# Patient Record
Sex: Male | Born: 1965 | Hispanic: Yes | Marital: Married | Smoking: Current some day smoker
Health system: Southern US, Community
[De-identification: ages and names within clinical notes are randomized; demographics above are authoritative.]

## PROBLEM LIST (undated history)

## (undated) DIAGNOSIS — R519 Headache, unspecified: Secondary | ICD-10-CM

## (undated) DIAGNOSIS — F1090 Alcohol use, unspecified, uncomplicated: Secondary | ICD-10-CM

## (undated) DIAGNOSIS — Z789 Other specified health status: Secondary | ICD-10-CM

## (undated) DIAGNOSIS — Z87891 Personal history of nicotine dependence: Secondary | ICD-10-CM

## (undated) HISTORY — DX: Other specified health status: Z78.9

## (undated) HISTORY — DX: Personal history of nicotine dependence: Z87.891

## (undated) HISTORY — DX: Alcohol use, unspecified, uncomplicated: F10.90

## (undated) HISTORY — DX: Headache, unspecified: R51.9

## (undated) HISTORY — PX: HAND SURGERY: SHX662

---

## 2021-10-21 ENCOUNTER — Encounter: Payer: Self-pay | Admitting: *Deleted

## 2021-10-21 ENCOUNTER — Other Ambulatory Visit: Payer: Self-pay

## 2021-10-21 DIAGNOSIS — R519 Headache, unspecified: Secondary | ICD-10-CM | POA: Diagnosis not present

## 2021-10-21 DIAGNOSIS — R42 Dizziness and giddiness: Secondary | ICD-10-CM | POA: Diagnosis not present

## 2021-10-21 DIAGNOSIS — R112 Nausea with vomiting, unspecified: Secondary | ICD-10-CM | POA: Insufficient documentation

## 2021-10-21 LAB — BASIC METABOLIC PANEL
Anion gap: 8 (ref 5–15)
BUN: 16 mg/dL (ref 6–20)
CO2: 23 mmol/L (ref 22–32)
Calcium: 9.4 mg/dL (ref 8.9–10.3)
Chloride: 106 mmol/L (ref 98–111)
Creatinine, Ser: 0.95 mg/dL (ref 0.61–1.24)
GFR, Estimated: >60 mL/min (ref >60)
Glucose, Bld: 143 mg/dL — ABNORMAL HIGH (ref 70–99)
Potassium: 3.8 mmol/L (ref 3.5–5.1)
Sodium: 137 mmol/L (ref 135–145)

## 2021-10-21 LAB — CBC
HCT: 45.1 % (ref 39.0–52.0)
Hemoglobin: 15.8 g/dL (ref 13.0–17.0)
MCH: 33.1 pg (ref 26.0–34.0)
MCHC: 35 g/dL (ref 30.0–36.0)
MCV: 94.5 fL (ref 80.0–100.0)
Platelets: 178 10*3/uL (ref 150–400)
RBC: 4.77 MIL/uL (ref 4.22–5.81)
RDW: 11.4 % — ABNORMAL LOW (ref 11.5–15.5)
WBC: 8.3 10*3/uL (ref 4.0–10.5)
nRBC: 0 % (ref 0.0–0.2)

## 2021-10-21 LAB — TROPONIN I (HIGH SENSITIVITY): Troponin I (High Sensitivity): <2 ng/L (ref <18)

## 2021-10-21 NOTE — ED Triage Notes (Signed)
Pt to ED reporting dizziness and vomiting since yesterday. Pt has hx of vertigo years ago and feels it is the same again. Pt vomiting whenever he moves his head too quickly.

## 2021-10-21 NOTE — ED Triage Notes (Signed)
First Nurse Note:  C/O vertigo x 1 day.

## 2021-10-22 ENCOUNTER — Other Ambulatory Visit: Payer: Self-pay

## 2021-10-22 ENCOUNTER — Emergency Department
Admission: EM | Admit: 2021-10-22 | Discharge: 2021-10-22 | Disposition: A | Discharge status: Home / Self Care | Payer: BC Managed Care – PPO | Attending: Emergency Medicine | Admitting: Emergency Medicine

## 2021-10-22 ENCOUNTER — Emergency Department: Payer: BC Managed Care – PPO

## 2021-10-22 DIAGNOSIS — R42 Dizziness and giddiness: Secondary | ICD-10-CM

## 2021-10-22 LAB — TROPONIN I (HIGH SENSITIVITY): Troponin I (High Sensitivity): <2 ng/L (ref <18)

## 2021-10-22 MED ORDER — LACTATED RINGERS IV BOLUS
1000.0000 mL | Freq: Once | INTRAVENOUS | Status: AC
  Administered 2021-10-22: 1000 mL via INTRAVENOUS

## 2021-10-22 MED ORDER — MECLIZINE HCL 25 MG PO TABS
25.0000 mg | ORAL_TABLET | Freq: Once | ORAL | Status: AC
  Administered 2021-10-22: 25 mg via ORAL
  Filled 2021-10-22: qty 1

## 2021-10-22 MED ORDER — MECLIZINE HCL 25 MG PO TABS: 25.0000 mg | ORAL_TABLET | Freq: Three times a day (TID) | ORAL | 0 refills | Status: DC | PRN

## 2021-10-22 MED ORDER — ONDANSETRON HCL 4 MG/2ML IJ SOLN
4.0000 mg | Freq: Once | INTRAMUSCULAR | Status: AC
  Administered 2021-10-22: 4 mg via INTRAVENOUS
  Filled 2021-10-22: qty 2

## 2021-10-22 NOTE — ED Notes (Signed)
Pt to MRI

## 2021-10-22 NOTE — ED Provider Notes (Signed)
Patient was signed out to me at 7 AM pending an MRI of his brain.  He has history of peripheral vertigo presents today with symptoms consistent with peripheral vertigo but persistent difficulty ambulating and symptoms after meclizine for MRI brain ordered to rule out central etiology.  Since MRI is negative.  We will provide a prescription for meclizine and ENT follow-up.   Georga Hacking, MD 10/22/21 [id number]

## 2021-10-22 NOTE — ED Notes (Signed)
Pt to CT

## 2021-10-22 NOTE — ED Notes (Signed)
Pt back from CT

## 2021-10-22 NOTE — ED Notes (Signed)
Pt a/o, states that he is still dizzy with ambulation. Edp at bedside.

## 2021-10-22 NOTE — ED Provider Notes (Signed)
[facility] Emergency Department Provider Note  ____________________________________________  Time seen: Approximately 4:29 AM  I have reviewed the triage vital signs and the nursing notes.   HISTORY  Chief Complaint Dizziness   HPI Ricardo Robertson is a 55 y.o. male with no significant past medical history who presents for evaluation of dizziness.  Patient reports 1 similar episode 14 years ago when he was diagnosed with vertigo and seen by ENT.  Patient reports that he had to do several months of inner ear exercise for his symptoms to resolve.  Patient reports that this episode started yesterday.  Sudden onset of severe room spinning sensation with, worse with head movement and associated with nausea and vomiting.  Today he feels slightly better but still present if he does brisk movements with his had.  He denies feeling lightheaded.  Reports that he had some mild soreness in the left side of his head but denies headache, chest pain, tinnitus, hearing loss, diplopia, dysarthria, dysphagia, slurred speech, facial droop, or unilateral weakness or numbness.  Patient reports that he smokes cigars.  Denies family history of stroke, aneurysm or SAH  PMH Vertigo   Allergies Patient has no known allergies.  History reviewed. No pertinent family history.  Social History Social History   Tobacco Use   Smoking status: Never   Smokeless tobacco: Never  Substance Use Topics   Alcohol use: Yes    Comment: socially    Review of Systems  Constitutional: Negative for fever. Eyes: Negative for visual changes. ENT: Negative for sore throat. Neck: No neck pain  Cardiovascular: Negative for chest pain. Respiratory: Negative for shortness of breath. Gastrointestinal: Negative for abdominal pain or diarrhea. + N/V Genitourinary: Negative for dysuria. Musculoskeletal: Negative for back pain. Skin: Negative for rash. Neurological: Negative for headaches,  weakness or numbness. + vertigo Psych: No SI or HI  ____________________________________________   PHYSICAL EXAM:  VITAL SIGNS: ED Triage Vitals [10/21/21 2003]  Enc Vitals Group     BP 124/84     Pulse Rate 76     Resp 16     Temp 97.6 F (36.4 C)     Temp Source Oral     SpO2 98 %     Weight      Height      Head Circumference      Peak Flow      Pain Score      Pain Loc      Pain Edu?      Excl. in GC?     Constitutional: Alert and oriented. Well appearing and in no apparent distress. HEENT:      Head: Normocephalic and atraumatic.         Eyes: Conjunctivae are normal. Sclera is non-icteric.       Mouth/Throat: Mucous membranes are moist.       Neck: Supple with no signs of meningismus. Cardiovascular: Regular rate and rhythm. No murmurs, gallops, or rubs. 2+ symmetrical distal pulses are present in all extremities. No JVD. Respiratory: Normal respiratory effort. Lungs are clear to auscultation bilaterally.  Gastrointestinal: Soft, non tender. Musculoskeletal:  No edema, cyanosis, or erythema of extremities. Neuro: A & O x3, PERRL, EOMI, no nystagmus, CN II-XII intact, motor testing reveals good tone and bulk throughout. There is no evidence of pronator drift or dysmetria. Muscle strength is 5/5 throughout. Deep tendon reflexes are 2+ throughout with downgoing toes. Sensory examination is intact. Gait is normal. Skin: Skin is warm, dry  and intact. No rash noted. Psychiatric: Mood and affect are normal. Speech and behavior are normal.  ____________________________________________   LABS (all labs ordered are listed, but only abnormal results are displayed)  Labs Reviewed  BASIC METABOLIC PANEL - Abnormal; Notable for the following components:      Result Value   Glucose, Bld 143 (*)    All other components within normal limits  CBC - Abnormal; Notable for the following components:   RDW 11.4 (*)    All other components within normal limits  TROPONIN I (HIGH  SENSITIVITY)  TROPONIN I (HIGH SENSITIVITY)   ____________________________________________  EKG  ED ECG REPORT I, Nita Sickle, the attending physician, personally viewed and interpreted this ECG.  Normal sinus rhythm with a rate of 75, normal intervals, normal axis, no ST elevations or depressions. ____________________________________________  RADIOLOGY  I have personally reviewed the images performed during this visit and I agree with the Radiologist's read.   Interpretation by Radiologist:  CT HEAD WO CONTRAST ( )  Result Date: 10/22/2021 CLINICAL DATA:  Dizziness.  Vomiting. EXAM: CT HEAD WITHOUT CONTRAST TECHNIQUE: Contiguous axial images were obtained from the base of the skull through the vertex without intravenous contrast. COMPARISON:  None. FINDINGS: Brain: There is no evidence for acute hemorrhage, hydrocephalus, mass lesion, or abnormal extra-axial fluid collection. No definite CT evidence for acute infarction. Vascular: No hyperdense vessel or unexpected calcification. Skull: No evidence for fracture. No worrisome lytic or sclerotic lesion. Sinuses/Orbits: The visualized paranasal sinuses and mastoid air cells are clear. Visualized portions of the globes and intraorbital fat are unremarkable. Other: None. IMPRESSION: No acute intracranial abnormality. Electronically Signed   By: Kennith Center M.D.   On: 10/22/2021 05:48     ____________________________________________   PROCEDURES  Procedure(s) performed:yes .1-3 Lead EKG Interpretation Performed by: Nita Sickle, MD Authorized by: Nita Sickle, MD     Interpretation: normal     ECG rate assessment: normal     Rhythm: sinus rhythm     Ectopy: none     Conduction: normal     Critical Care performed:  None ____________________________________________   INITIAL IMPRESSION / ASSESSMENT AND PLAN / ED COURSE  55 y.o. male with no significant past medical history who presents for evaluation of  dizziness worse with head movement described as room spinning sensation associated with nausea and vomiting x 2 days. Similar symptoms 14 years ago and diagnosed with peripheral vertigo.  Patient is extremely well-appearing in no distress, normal vital signs, completely normal neurological exam.  Differential diagnosis including peripheral vertigo versus central vertigo versus subarachnoid hemorrhage versus cerebellar stroke versus dehydration versus dysrhythmias.  EKG with no signs of dysrhythmias.  Patient placed on telemetry for monitoring.  Labs showing no signs of significant dehydration, AKI, electrolyte derangements, anemia.  2 high-sensitivity troponins negative with no signs of ischemia.  Head CT is pending.  Will treat with meclizine, IV fluids and Zofran.  History gathered from patient and his wife's at bedside.  Plan discussed with both of them.  _________________________ 6:00 AM on 10/22/2021 ----------------------------------------- CT unremarkable.  After meclizine, fluids and Zofran I attempted to ambulate patient.  He still feels extremely off balance with a wide unsteady gait.  We will pursue an MRI.  _________________________ 7:01 AM on 10/22/2021 ----------------------------------------- MRI pending. Care transferred to incoming MD.     _____________________________________________ Please note:  Patient was evaluated in Emergency Department today for the symptoms described in the history of present illness. Patient was evaluated in the context  of the global COVID-19 pandemic, which necessitated consideration that the patient might be at risk for infection with the SARS-CoV-2 virus that causes COVID-19. Institutional protocols and algorithms that pertain to the evaluation of patients at risk for COVID-19 are in a state of rapid change based on information released by regulatory bodies including the CDC and federal and state organizations. These policies and algorithms were followed  during the patient's care in the ED.  Some ED evaluations and interventions may be delayed as a result of limited staffing during the pandemic.   [place] Controlled Substance Database was reviewed by me. ____________________________________________   FINAL CLINICAL IMPRESSION(S) / ED DIAGNOSES   Final diagnoses:  Vertigo      NEW MEDICATIONS STARTED DURING THIS VISIT:  ED Discharge Orders     None        Note:  This document was prepared using Dragon voice recognition software and may include unintentional dictation errors.    Nita Sickle, MD 10/22/21 [phone]

## 2022-10-24 IMAGING — CT CT HEAD W/O CM
3 series · 16 of 47 positions shown, 19 images · non-contrast
Comparison: None.

CLINICAL DATA: Dizziness.  Vomiting.

EXAM:
CT HEAD WITHOUT CONTRAST
TECHNIQUE: Contiguous axial images were obtained from the base of the skull
through the vertex without intravenous contrast.

[Series 3: head wo · axial · 0.42mm/px · z∈[-131,-6]mm · 10 of 30 slices shown, 13 images]
[im 3/30  brain]
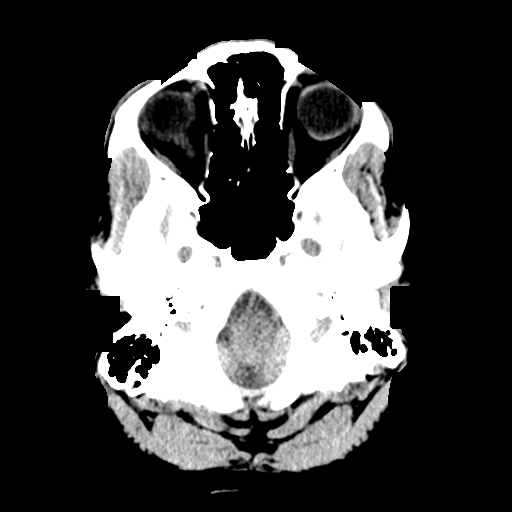
[im 3/30  bone]
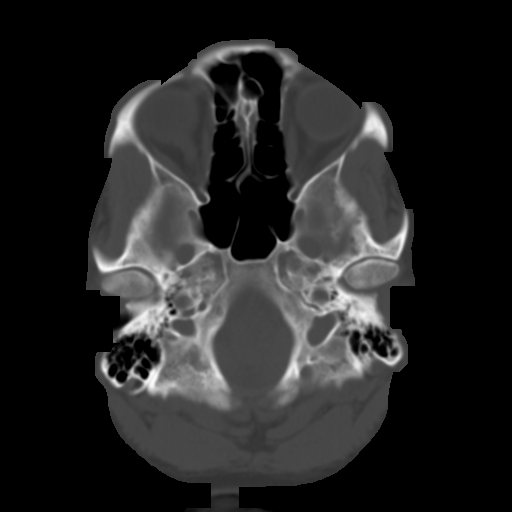
[im 6/30  brain]
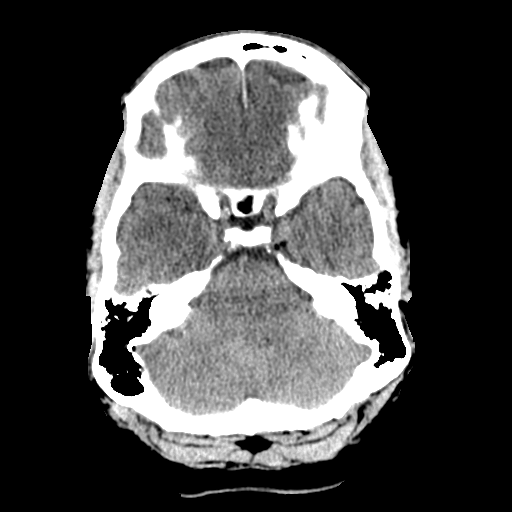
[im 9/30  brain]
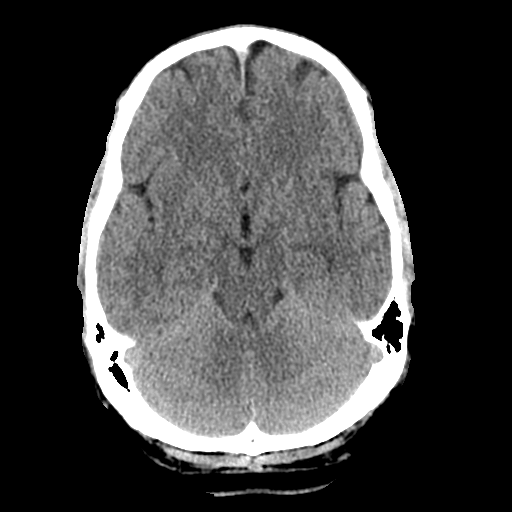
[im 11/30  brain]
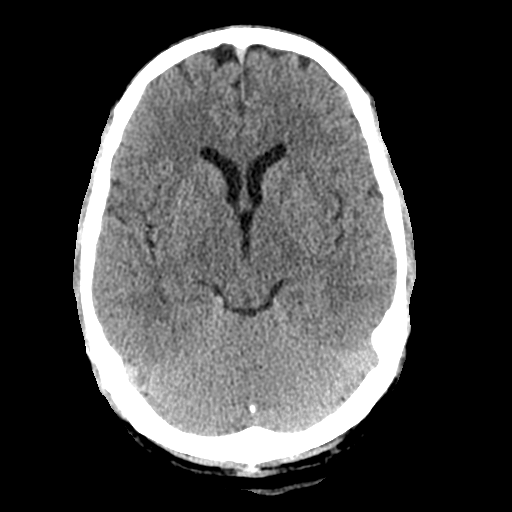
[im 14/30  brain]
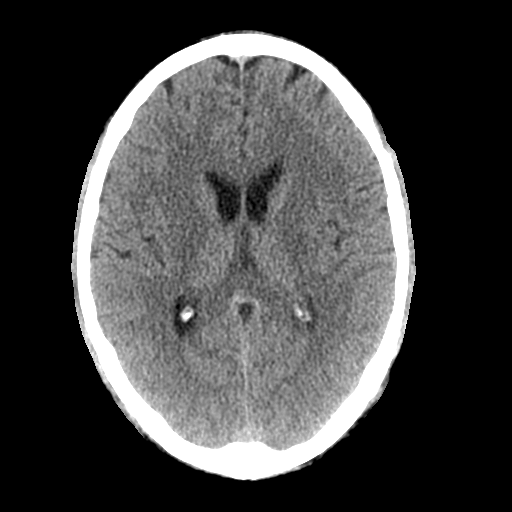
[im 14/30  bone]
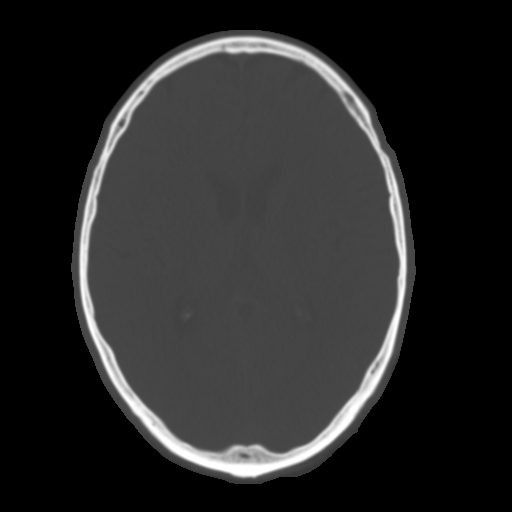
[im 17/30  brain]
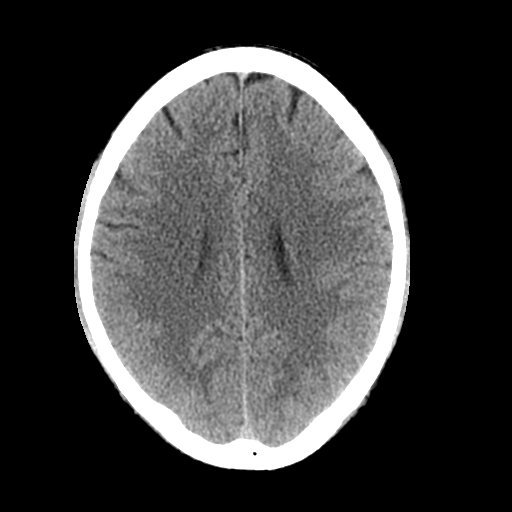
[im 20/30  brain]
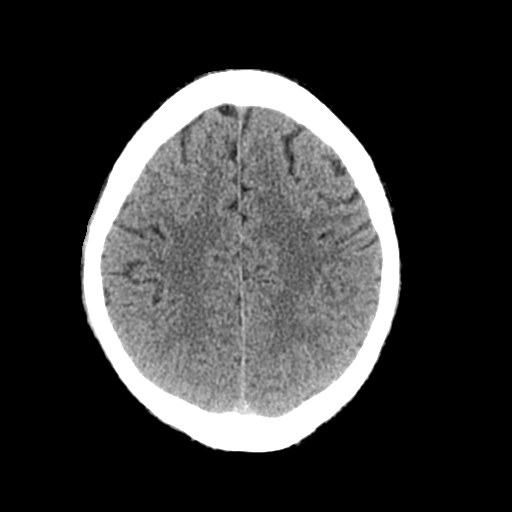
[im 23/30  brain]
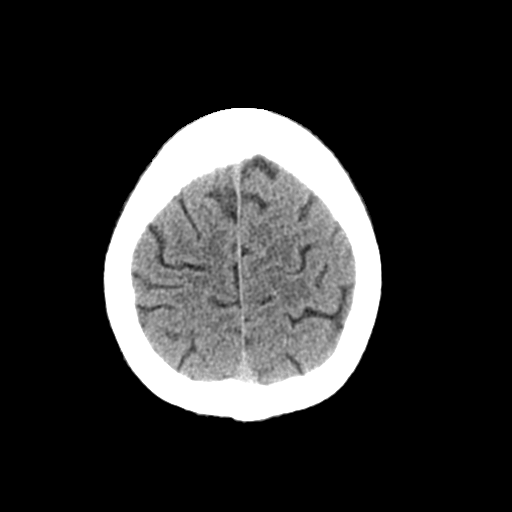
[im 25/30  brain]
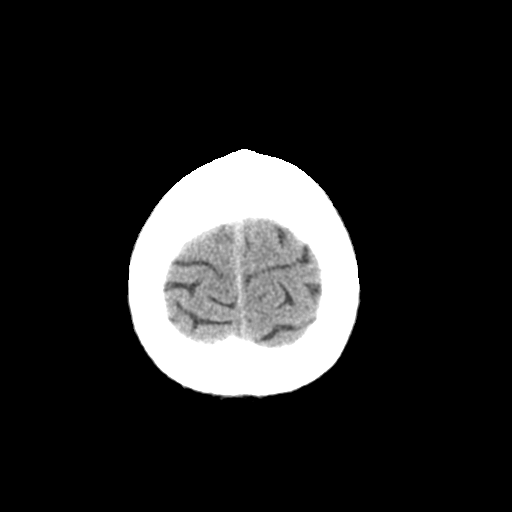
[im 25/30  bone]
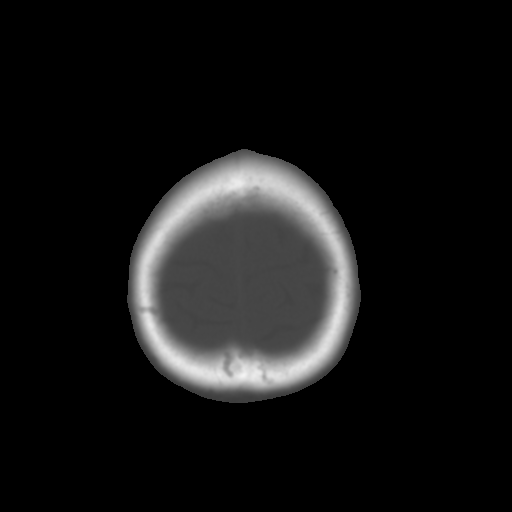
[im 28/30  brain]
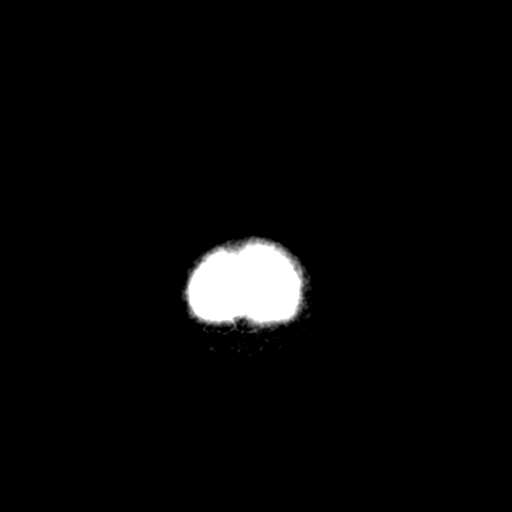

[Series 4: coronal soft tissue · coronal · 0.31mm/px · 3 of 68 slices shown]
[im 23/68  brain]
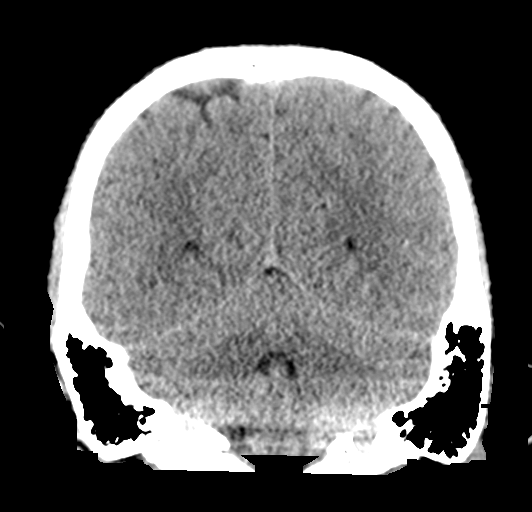
[im 30/68  brain]
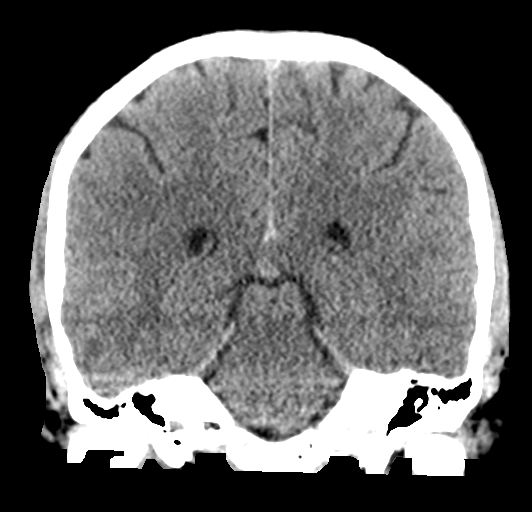
[im 38/68  brain]
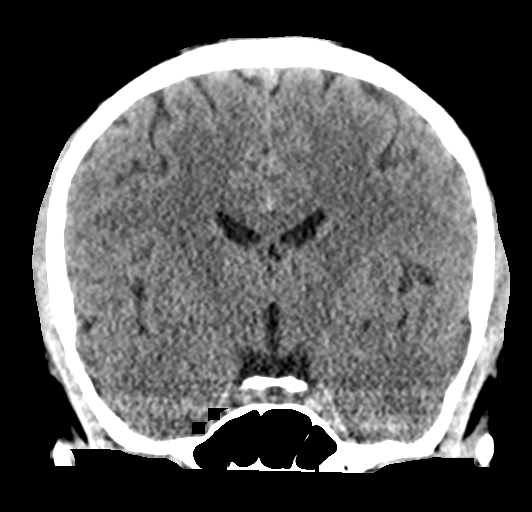

[Series 5: sagittal soft tissue · sagittal · 0.31mm/px · 3 of 55 slices shown]
[im 19/55  brain]
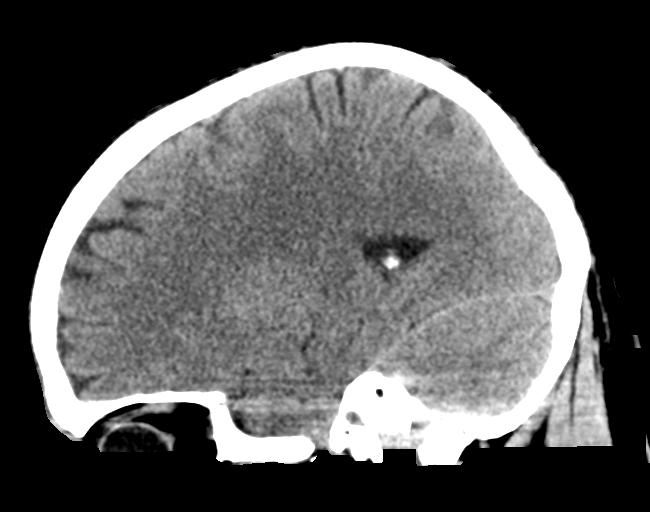
[im 28/55  brain]
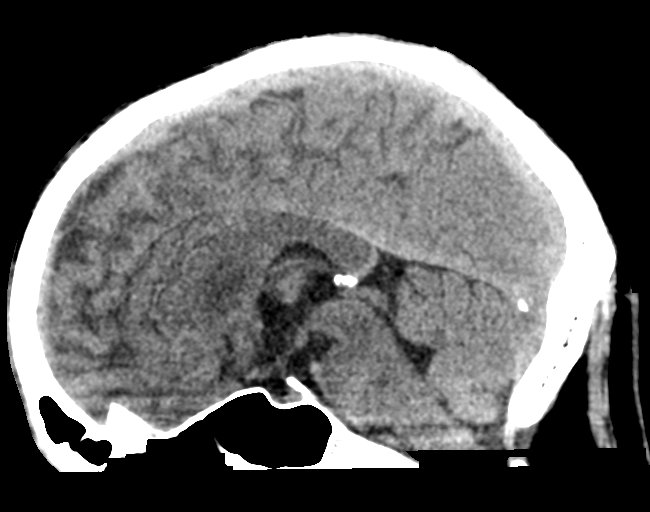
[im 37/55  brain]
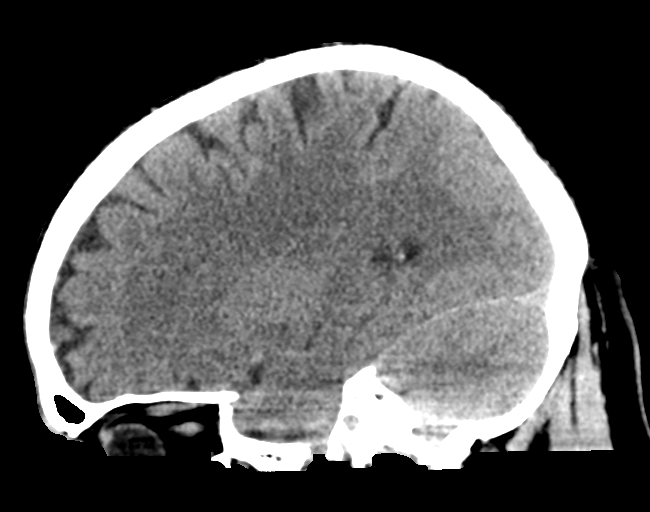

[16 of 47 positions shown; findings below may reference images not displayed]

FINDINGS: Brain: There is no evidence for acute hemorrhage, hydrocephalus,
mass lesion, or abnormal extra-axial fluid collection. No definite
CT evidence for acute infarction.

Vascular: No hyperdense vessel or unexpected calcification.

Skull: No evidence for fracture. No worrisome lytic or sclerotic
lesion.

Sinuses/Orbits: The visualized paranasal sinuses and mastoid air
cells are clear. Visualized portions of the globes and intraorbital
fat are unremarkable.

Other: None.
IMPRESSION: No acute intracranial abnormality.

## 2023-09-28 ENCOUNTER — Encounter: Payer: Self-pay | Admitting: Emergency Medicine

## 2023-09-28 ENCOUNTER — Ambulatory Visit
Admission: EM | Admit: 2023-09-28 | Discharge: 2023-09-28 | Disposition: A | Discharge status: Home / Self Care | Payer: BC Managed Care – PPO | Attending: Physician Assistant | Admitting: Physician Assistant

## 2023-09-28 DIAGNOSIS — M5441 Lumbago with sciatica, right side: Secondary | ICD-10-CM | POA: Diagnosis not present

## 2023-09-28 MED ORDER — CYCLOBENZAPRINE HCL 10 MG PO TABS: 10.0000 mg | ORAL_TABLET | Freq: Two times a day (BID) | ORAL | 0 refills | Status: DC | PRN

## 2023-09-28 MED ORDER — PREDNISONE 20 MG PO TABS: 40.0000 mg | ORAL_TABLET | Freq: Every day | ORAL | 0 refills | Status: AC

## 2023-09-28 NOTE — ED Triage Notes (Signed)
Patient states that x 2 days ago he was lifting a bag of sand and turned the wrong way.  Now he is having right sided lower back pain that radiates down right leg.  Patient has been taken Motrin for pain.

## 2023-09-28 NOTE — ED Provider Notes (Signed)
[facility]    CSN: [id number] Arrival date & time: 09/28/23  1426      History   Chief Complaint Chief Complaint  Patient presents with   Back Pain    HPI Ricardo Robertson is a 57 y.o. male.   Patient here today for evaluation of bilateral low back pain that started 2 days ago after he was lifting a bag of sand and turned the wrong way.  He states that most of his pain is present to his right lower back and does radiate down his right leg at times.  He denies any numbness or weakness.  He has tried taking Motrin without resolution.  The history is provided by the patient.  Back Pain Associated symptoms: no fever, no numbness and no weakness     History reviewed. No pertinent past medical history.  There are no problems to display for this patient.   History reviewed. No pertinent surgical history.     Home Medications    Prior to Admission medications   Medication Sig Start Date End Date Taking? Authorizing Provider  cyclobenzaprine (FLEXERIL) 10 MG tablet Take 1 tablet (10 mg total) by mouth 2 (two) times daily as needed for muscle spasms. 09/28/23  Yes Tomi Bamberger, PA-C  predniSONE (DELTASONE) 20 MG tablet Take 2 tablets (40 mg total) by mouth daily with breakfast for 5 days. 09/28/23 10/03/23 Yes Tomi Bamberger, PA-C  meclizine (ANTIVERT) 25 MG tablet Take 1 tablet (25 mg total) by mouth 3 (three) times daily as needed for dizziness. 10/22/21   Nita Sickle, MD    Family History History reviewed. No pertinent family history.  Social History Social History   Tobacco Use   Smoking status: Some Days    Types: Cigars   Smokeless tobacco: Never  Vaping Use   Vaping status: Never Used  Substance Use Topics   Alcohol use: Yes    Comment: socially   Drug use: Never     Allergies   Patient has no known allergies.   Review of Systems Review of Systems  Constitutional:  Negative for chills and fever.  Eyes:  Negative for  discharge and redness.  Respiratory:  Negative for shortness of breath.   Musculoskeletal:  Positive for back pain.  Neurological:  Negative for weakness and numbness.     Physical Exam Triage Vital Signs ED Triage Vitals  Encounter Vitals Group     BP 09/28/23 1514 113/76     Systolic BP Percentile --      Diastolic BP Percentile --      Pulse Rate 09/28/23 1514 72     Resp 09/28/23 1514 18     Temp 09/28/23 1514 98.6 F (37 C)     Temp Source 09/28/23 1514 Oral     SpO2 09/28/23 1514 94 %     Weight 09/28/23 1516 163 lb (73.9 kg)     Height 09/28/23 1516 5\' 7"  (1.702 m)     Head Circumference --      Peak Flow --      Pain Score 09/28/23 1515 3     Pain Loc --      Pain Education --      Exclude from Growth Chart --    No data found.  Updated Vital Signs BP 113/76 (BP Location: Left Arm)   Pulse 72   Temp 98.6 F (37 C) (Oral)   Resp 18   Ht 5\' 7"  (1.702 m)  Wt 163 lb (73.9 kg)   SpO2 94%   BMI 25.53 kg/m      Physical Exam Vitals and nursing note reviewed.  Constitutional:      General: He is not in acute distress.    Appearance: Normal appearance. He is not ill-appearing.  HENT:     Head: Normocephalic and atraumatic.  Eyes:     Conjunctiva/sclera: Conjunctivae normal.  Cardiovascular:     Rate and Rhythm: Normal rate.  Pulmonary:     Effort: Pulmonary effort is normal. No respiratory distress.  Musculoskeletal:     Comments: No tenderness to palpation to midline thoracic or lumbar spine.  Mild tenderness to palpation to right lower back  Neurological:     Mental Status: He is alert.  Psychiatric:        Mood and Affect: Mood normal.        Behavior: Behavior normal.        Thought Content: Thought content normal.      UC Treatments / Results  Labs (all labs ordered are listed, but only abnormal results are displayed) Labs Reviewed - No data to display  EKG   Radiology No results found.  Procedures Procedures (including critical  care time)  Medications Ordered in UC Medications - No data to display  Initial Impression / Assessment and Plan / UC Course  I have reviewed the triage vital signs and the nursing notes.  Pertinent labs & imaging results that were available during my care of the patient were reviewed by me and considered in my medical decision making (see chart for details).   Suspect likely muscular strain with associated sciatica.  Will treat with steroid burst muscle relaxer.  Advised that muscle relaxer may cause drowsiness and use with caution.  Encouraged follow-up with any further concerns or persistent or worsening symptoms.  Final Clinical Impressions(s) / UC Diagnoses   Final diagnoses:  Acute bilateral low back pain with right-sided sciatica   Discharge Instructions   None    ED Prescriptions     Medication Sig Dispense Auth. Provider   predniSONE (DELTASONE) 20 MG tablet Take 2 tablets (40 mg total) by mouth daily with breakfast for 5 days. 10 tablet Erma Pinto F, PA-C   cyclobenzaprine (FLEXERIL) 10 MG tablet Take 1 tablet (10 mg total) by mouth 2 (two) times daily as needed for muscle spasms. 20 tablet Tomi Bamberger, PA-C      PDMP not reviewed this encounter.   Tomi Bamberger, PA-C 09/28/23 1554

## 2023-09-29 ENCOUNTER — Encounter: Payer: Self-pay | Admitting: Family

## 2023-09-29 ENCOUNTER — Ambulatory Visit: Payer: BC Managed Care – PPO | Admitting: Family

## 2023-09-29 VITALS — BP 128/76 | HR 80 | Temp 98.1°F | Resp 20 | Ht 67.0 in | Wt 167.6 lb

## 2023-09-29 DIAGNOSIS — Z789 Other specified health status: Secondary | ICD-10-CM | POA: Insufficient documentation

## 2023-09-29 DIAGNOSIS — R519 Headache, unspecified: Secondary | ICD-10-CM | POA: Diagnosis not present

## 2023-09-29 DIAGNOSIS — Z2821 Immunization not carried out because of patient refusal: Secondary | ICD-10-CM | POA: Insufficient documentation

## 2023-09-29 DIAGNOSIS — Z13220 Encounter for screening for lipoid disorders: Secondary | ICD-10-CM

## 2023-09-29 DIAGNOSIS — Z1211 Encounter for screening for malignant neoplasm of colon: Secondary | ICD-10-CM

## 2023-09-29 DIAGNOSIS — Z113 Encounter for screening for infections with a predominantly sexual mode of transmission: Secondary | ICD-10-CM

## 2023-09-29 DIAGNOSIS — Z87891 Personal history of nicotine dependence: Secondary | ICD-10-CM | POA: Insufficient documentation

## 2023-09-29 DIAGNOSIS — M545 Low back pain, unspecified: Secondary | ICD-10-CM | POA: Diagnosis not present

## 2023-09-29 DIAGNOSIS — Z125 Encounter for screening for malignant neoplasm of prostate: Secondary | ICD-10-CM

## 2023-09-29 DIAGNOSIS — Z1159 Encounter for screening for other viral diseases: Secondary | ICD-10-CM

## 2023-09-29 NOTE — Progress Notes (Signed)
Provider: Richarda Blade FNP-C   Rayonna Heldman, Donalee Citrin, NP  Patient Care Team: Sheyann Sulton, Donalee Citrin, NP as PCP - General (Family Medicine)  Extended Emergency Contact Information Primary Emergency Contact: ramirez,mildred Mobile Phone: [phone] Relation: Spouse  Code Status:  Full Code  Goals of care: Advanced Directive information    10/21/2021    8:05 PM  Advanced Directives  Does Patient Have a Medical Advance Directive? No  Would patient like information on creating a medical advance directive? No - Patient declined     Chief Complaint  Patient presents with   New Patient (Initial Visit)    Patient presents today for new patient appointment.    HPI:  Pt is a 57 y.o. male seen today establish care here at [facility]. States has no chronic medical conditions.  He pulled a muscle on Sunday while trying to lift sand bag.He went to urgent care yesterday 09/27/2022 was prescribed prednisone and Flexeril.states started on prednisone this morning.still has pain on lower back worst on the right side.He denies any radiation to lower extremities, numbness,tingling or loss of bladder or bowel.   Former cigarette smoker - smoked from 18 yrs to 30 yrs 6-8 cigarettes per day.   Drinks 6 beers per week  No use of illicit drugs.   Headaches - Has occasional headache.described as behind both eyes.tends to occur with drinking of beer but sometimes occurs without any use of beer.usually drinks coffee and it goes away.No nausea or vomiting.Motrin effective. Tylenol does not help.   States used to exercise but currently just does a lot of walking up and down and stays busy at work.   Request Testosterone level to be checked but declines any sexual /erectile dysfunction.wonders whether need to be checked since he is in his 14's.  Would also like prostate screening.   Immunization -due for COVID 19, DTaP, influenza and zoster vaccine.  But declines states does not need  vaccines.  History reviewed. No pertinent past medical history. Past Surgical History:  Procedure Laterality Date   HAND SURGERY Right     No Known Allergies  Allergies as of 09/29/2023   No Known Allergies      Medication List        Accurate as of September 29, 2023 10:38 AM. If you have any questions, ask your nurse or doctor.          cyclobenzaprine 10 MG tablet Commonly known as: FLEXERIL Take 1 tablet (10 mg total) by mouth 2 (two) times daily as needed for muscle spasms.   meclizine 25 MG tablet Commonly known as: ANTIVERT Take 1 tablet (25 mg total) by mouth 3 (three) times daily as needed for dizziness.   predniSONE 20 MG tablet Commonly known as: DELTASONE Take 2 tablets (40 mg total) by mouth daily with breakfast for 5 days.        Review of Systems  Constitutional:  Negative for appetite change, chills, fatigue, fever and unexpected weight change.  HENT:  Negative for congestion, dental problem, ear discharge, ear pain, facial swelling, hearing loss, nosebleeds, postnasal drip, rhinorrhea, sinus pressure, sinus pain, sneezing, sore throat, tinnitus and trouble swallowing.   Eyes:  Positive for visual disturbance. Negative for pain, discharge, redness and itching.       Wears eye glasses has not seen ophthalmologist in one year  Respiratory:  Negative for cough, chest tightness, shortness of breath and wheezing.   Cardiovascular:  Negative for chest pain, palpitations and  leg swelling.  Gastrointestinal:  Negative for abdominal distention, abdominal pain, blood in stool, constipation, diarrhea, nausea and vomiting.  Endocrine: Negative for cold intolerance, heat intolerance, polydipsia, polyphagia and polyuria.  Genitourinary:  Negative for difficulty urinating, dysuria, flank pain, frequency and urgency.  Musculoskeletal:  Negative for arthralgias, back pain, gait problem, joint swelling, myalgias, neck pain and neck stiffness.  Skin:  Negative for color  change, pallor, rash and wound.  Neurological:  Negative for dizziness, syncope, speech difficulty, weakness, light-headedness, numbness and headaches.  Hematological:  Does not bruise/bleed easily.  Psychiatric/Behavioral:  Negative for agitation, behavioral problems, confusion, hallucinations, self-injury, sleep disturbance and suicidal ideas. The patient is not nervous/anxious.      There is no immunization history on file for this patient. Pertinent  Health Maintenance Due  Topic Date Due   Colonoscopy  Never done   INFLUENZA VACCINE  Never done      10/21/2021    8:06 PM  Fall Risk  (RETIRED) Patient Fall Risk Level Low fall risk   Functional Status Survey:    Vitals:   09/29/23 1004  BP: 128/76  Pulse: 80  Resp: 20  Temp: 98.1 F (36.7 C)  SpO2: 98%  Weight: 167 lb 9.6 oz (76 kg)  Height: 5\' 7"  (1.702 m)   Body mass index is 26.25 kg/m. Physical Exam Vitals reviewed.  Constitutional:      General: He is not in acute distress.    Appearance: Normal appearance. He is overweight. He is not ill-appearing or diaphoretic.  HENT:     Head: Normocephalic.     Right Ear: Tympanic membrane, ear canal and external ear normal. There is no impacted cerumen.     Left Ear: Tympanic membrane, ear canal and external ear normal. There is no impacted cerumen.     Nose: Nose normal. No congestion or rhinorrhea.     Mouth/Throat:     Mouth: Mucous membranes are moist.     Pharynx: Oropharynx is clear. No oropharyngeal exudate or posterior oropharyngeal erythema.  Eyes:     General: No scleral icterus.       Right eye: No discharge.        Left eye: No discharge.     Extraocular Movements: Extraocular movements intact.     Conjunctiva/sclera: Conjunctivae normal.     Pupils: Pupils are equal, round, and reactive to light.  Neck:     Vascular: No carotid bruit.  Cardiovascular:     Rate and Rhythm: Normal rate and regular rhythm.     Pulses: Normal pulses.     Heart sounds:  Normal heart sounds. No murmur heard.    No friction rub. No gallop.  Pulmonary:     Effort: Pulmonary effort is normal. No respiratory distress.     Breath sounds: Normal breath sounds. No wheezing, rhonchi or rales.  Chest:     Chest wall: No tenderness.  Abdominal:     General: Bowel sounds are normal. There is no distension.     Palpations: Abdomen is soft. There is no mass.     Tenderness: There is no abdominal tenderness. There is no right CVA tenderness, left CVA tenderness, guarding or rebound.  Musculoskeletal:        General: No swelling or tenderness. Normal range of motion.     Cervical back: Normal range of motion. No rigidity or tenderness.     Right lower leg: No edema.     Left lower leg: No edema.  Lymphadenopathy:  Cervical: No cervical adenopathy.  Skin:    General: Skin is warm and dry.     Coloration: Skin is not pale.     Findings: No bruising, erythema, lesion or rash.  Neurological:     Mental Status: He is alert and oriented to person, place, and time.     Cranial Nerves: No cranial nerve deficit.     Sensory: No sensory deficit.     Motor: No weakness.     Coordination: Coordination normal.     Gait: Gait normal.  Psychiatric:        Mood and Affect: Mood normal.        Speech: Speech normal.        Behavior: Behavior normal.        Thought Content: Thought content normal.        Judgment: Judgment normal.     Labs reviewed: No results for input(s): "NA", "K", "CL", "CO2", "GLUCOSE", "BUN", "CREATININE", "CALCIUM", "MG", "PHOS" in the last 8760 hours. No results for input(s): "AST", "ALT", "ALKPHOS", "BILITOT", "PROT", "ALBUMIN" in the last 8760 hours. No results for input(s): "WBC", "NEUTROABS", "HGB", "HCT", "MCV", "PLT" in the last 8760 hours. No results found for: "TSH" No results found for: "HGBA1C" No results found for: "CHOL", "HDL", "LDLCALC", "LDLDIRECT", "TRIG", "CHOLHDL"  Significant Diagnostic Results in last 30 days:  No results  found.  Assessment/Plan 1.. Headache behind the eyes Chronic seems to be related to [facility] intake.  Motrin effective -Beer use cessation advised. - COMPLETE METABOLIC PANEL WITH GFR; Future - CBC with Differential/Platelet; Future  2. Acute left-sided low back pain without sciatica Status post urgent care visit 09/28/2023 after he pulled a muscle while lifting up sand bag -Continue on prednisone and Flexeril as prescribed in the urgent care -Notify provider if symptoms worsen or not resolved   3. Screening for hyperlipidemia no recent LDL for review -Dietary modification and exercise at least 30 minutes 3 times per week - Lipid panel; Future  4. Screen for STD (sexually transmitted disease) Married.  No high risk behaviors reported - HIV Antibody (routine testing w rflx); Future  5. Encounter for hepatitis C screening test for low risk patient Low risk - Hepatitis C antibody; Future  6. Prostate cancer screening Request screening.  Asymptomatic - PSA, Total and Free; Future  7. Colon cancer screening Cologuard versus colonoscopy discussed patient prefers colonoscopy.  Will refer to gastroenterology.Made aware specialist office will call him to schedule appointment. - Ambulatory referral to Gastroenterology  8. Alcohol use Drinks 6 beers per week -Alcohol use cessation advised  10. Former light cigarette smoker (1-9 per day) Former cigarette smoker from age 57 to 36 years.Smoked 6-8 cigarettes per day.  Asymptomatic  11. Influenza vaccination declined Influenza vaccine offered but declines  12. Tetanus, diphtheria, and acellular pertussis (Tdap) vaccination declined Tdap due but patient declined  13. Herpes zoster vaccination declined Advised to get Shingrix vaccine at the pharmacy but declined.  Family/ staff Communication: Reviewed plan of care with patient verbalized understanding  Labs/tests ordered:  - COMPLETE METABOLIC PANEL WITH GFR; Future - CBC with  Differential/Platelet; Future - Hepatitis C antibody; Future - PSA, Total and Free; Future - HIV Antibody (routine testing w rflx); Future - Lipid panel; Future  Next Appointment : Return in about 1 year (around 09/28/2024) for annual Physical examination, Fasting labs in the morning.   Caesar Bookman, NP

## 2023-09-30 ENCOUNTER — Other Ambulatory Visit: Payer: BC Managed Care – PPO

## 2023-09-30 DIAGNOSIS — R519 Headache, unspecified: Secondary | ICD-10-CM

## 2023-09-30 DIAGNOSIS — Z113 Encounter for screening for infections with a predominantly sexual mode of transmission: Secondary | ICD-10-CM

## 2023-09-30 DIAGNOSIS — Z125 Encounter for screening for malignant neoplasm of prostate: Secondary | ICD-10-CM

## 2023-09-30 DIAGNOSIS — Z13220 Encounter for screening for lipoid disorders: Secondary | ICD-10-CM

## 2023-09-30 DIAGNOSIS — Z1159 Encounter for screening for other viral diseases: Secondary | ICD-10-CM

## 2023-10-01 LAB — PSA, TOTAL AND FREE
PSA, % Free: 19 % — ABNORMAL LOW (ref >25)
PSA, Free: 0.3 ng/mL
PSA, Total: 1.6 ng/mL (ref <=4.0)

## 2023-10-01 LAB — COMPLETE METABOLIC PANEL WITH GFR
AG Ratio: 1.7 (calc) (ref 1.0–2.5)
ALT: 58 U/L — ABNORMAL HIGH (ref 9–46)
AST: 30 U/L (ref 10–35)
Albumin: 4.3 g/dL (ref 3.6–5.1)
Alkaline phosphatase (APISO): 62 U/L (ref 35–144)
BUN: 17 mg/dL (ref 7–25)
CO2: 24 mmol/L (ref 20–32)
Calcium: 9.4 mg/dL (ref 8.6–10.3)
Chloride: 105 mmol/L (ref 98–110)
Creat: 0.89 mg/dL (ref 0.70–1.30)
Globulin: 2.6 g/dL (ref 1.9–3.7)
Glucose, Bld: 92 mg/dL (ref 65–99)
Potassium: 3.9 mmol/L (ref 3.5–5.3)
Sodium: 140 mmol/L (ref 135–146)
Total Bilirubin: 0.5 mg/dL (ref 0.2–1.2)
Total Protein: 6.9 g/dL (ref 6.1–8.1)
eGFR: 101 mL/min/{1.73_m2} (ref >=60)

## 2023-10-01 LAB — CBC WITH DIFFERENTIAL/PLATELET
Absolute Lymphocytes: 2174 {cells}/uL (ref 850–3900)
Absolute Monocytes: 967 {cells}/uL — ABNORMAL HIGH (ref 200–950)
Basophils Absolute: 73 {cells}/uL (ref 0–200)
Basophils Relative: 0.7 %
Eosinophils Absolute: 62 {cells}/uL (ref 15–500)
Eosinophils Relative: 0.6 %
HCT: 45.7 % (ref 38.5–50.0)
Hemoglobin: 15.2 g/dL (ref 13.2–17.1)
MCH: 32.4 pg (ref 27.0–33.0)
MCHC: 33.3 g/dL (ref 32.0–36.0)
MCV: 97.4 fL (ref 80.0–100.0)
MPV: 10 fL (ref 7.5–12.5)
Monocytes Relative: 9.3 %
Neutro Abs: 7124 {cells}/uL (ref 1500–7800)
Neutrophils Relative %: 68.5 %
Platelets: 262 10*3/uL (ref 140–400)
RBC: 4.69 10*6/uL (ref 4.20–5.80)
RDW: 10.9 % — ABNORMAL LOW (ref 11.0–15.0)
Total Lymphocyte: 20.9 %
WBC: 10.4 10*3/uL (ref 3.8–10.8)

## 2023-10-01 LAB — HIV ANTIBODY (ROUTINE TESTING W REFLEX): HIV 1&2 Ab, 4th Generation: NONREACTIVE

## 2023-10-01 LAB — HEPATITIS C ANTIBODY: Hepatitis C Ab: NONREACTIVE

## 2023-10-01 NOTE — Telephone Encounter (Signed)
Patient scheduled an appointment for next week.

## 2023-10-05 ENCOUNTER — Encounter: Payer: Self-pay | Admitting: Family

## 2023-10-05 ENCOUNTER — Ambulatory Visit: Payer: BC Managed Care – PPO | Admitting: Family

## 2023-10-05 VITALS — BP 128/72 | HR 87 | Temp 98.6°F | Resp 20 | Ht 67.0 in | Wt 168.4 lb

## 2023-10-05 DIAGNOSIS — E782 Mixed hyperlipidemia: Secondary | ICD-10-CM | POA: Diagnosis not present

## 2023-10-05 DIAGNOSIS — M545 Low back pain, unspecified: Secondary | ICD-10-CM

## 2023-10-05 NOTE — Progress Notes (Signed)
Provider: Richarda Blade FNP-C  Sathvik Tiedt, Donalee Citrin, NP  Patient Care Team: Fradel Baldonado, Donalee Citrin, NP as PCP - General (Family Medicine)  Extended Emergency Contact Information Primary Emergency Contact: ramirez,mildred Mobile Phone: [phone] Relation: Spouse  Code Status:  Full Code  Goals of care: Advanced Directive information    10/21/2021    8:05 PM  Advanced Directives  Does Patient Have a Medical Advance Directive? No  Would patient like information on creating a medical advance directive? No - Patient declined     Chief Complaint  Patient presents with   Acute Visit    Patient presents today for lab results and paperwork    HPI:  Pt is a 57 y.o. male seen today for an acute visit for discuss lab results and complete FMLA paper work.  States missed work last week due to lower back pain.states pain worst with bending. He denies any weakness,numbness or tingling on the extremities.   Past Medical History:  Diagnosis Date   Alcohol use    Former light cigarette smoker (1-9 per day)    Headache behind the eyes    Past Surgical History:  Procedure Laterality Date   HAND SURGERY Right     No Known Allergies  Outpatient Encounter Medications as of 10/05/2023  Medication Sig   cyclobenzaprine (FLEXERIL) 10 MG tablet Take 1 tablet (10 mg total) by mouth 2 (two) times daily as needed for muscle spasms. (Patient not taking: Reported on 10/05/2023)   meclizine (ANTIVERT) 25 MG tablet Take 1 tablet (25 mg total) by mouth 3 (three) times daily as needed for dizziness. (Patient not taking: Reported on 09/29/2023)   No facility-administered encounter medications on file as of 10/05/2023.    Review of Systems  Constitutional:  Negative for appetite change, chills, fatigue, fever and unexpected weight change.  HENT:  Negative for congestion, dental problem, ear discharge, ear pain, facial swelling, hearing loss, nosebleeds, postnasal drip, rhinorrhea, sinus pressure,  sinus pain, sneezing, sore throat, tinnitus and trouble swallowing.   Eyes:  Negative for pain, discharge, redness, itching and visual disturbance.  Respiratory:  Negative for cough, chest tightness, shortness of breath and wheezing.   Cardiovascular:  Negative for chest pain, palpitations and leg swelling.  Gastrointestinal:  Negative for abdominal distention, abdominal pain, blood in stool, constipation, diarrhea, nausea and vomiting.  Endocrine: Negative for cold intolerance, heat intolerance, polydipsia, polyphagia and polyuria.  Genitourinary:  Negative for difficulty urinating, dysuria, flank pain, frequency and urgency.  Musculoskeletal:  Positive for back pain. Negative for arthralgias, gait problem, joint swelling, myalgias, neck pain and neck stiffness.       Lower back pain slight improved  Skin:  Negative for color change, pallor, rash and wound.  Neurological:  Negative for dizziness, syncope, speech difficulty, weakness, light-headedness, numbness and headaches.  Hematological:  Does not bruise/bleed easily.  Psychiatric/Behavioral:  Negative for agitation, behavioral problems, confusion, hallucinations, self-injury, sleep disturbance and suicidal ideas. The patient is not nervous/anxious.      There is no immunization history on file for this patient. Pertinent  Health Maintenance Due  Topic Date Due   Colonoscopy  Never done   INFLUENZA VACCINE  03/07/2024 (Originally 07/09/2023)      10/21/2021    8:06 PM  Fall Risk  (RETIRED) Patient Fall Risk Level Low fall risk   Functional Status Survey:    Vitals:   10/05/23 0929  BP: 128/72  Pulse: 87  Resp: 20  Temp: 98.6 F (37 C)  SpO2:  98%  Weight: 168 lb 6.4 oz (76.4 kg)  Height: 5\' 7"  (1.702 m)   Body mass index is 26.38 kg/m. Physical Exam Vitals reviewed.  Constitutional:      General: He is not in acute distress.    Appearance: Normal appearance. He is normal weight. He is not ill-appearing or diaphoretic.   HENT:     Head: Normocephalic.  Eyes:     General: No scleral icterus.       Right eye: No discharge.        Left eye: No discharge.     Conjunctiva/sclera: Conjunctivae normal.     Pupils: Pupils are equal, round, and reactive to light.  Neck:     Vascular: No carotid bruit.  Cardiovascular:     Rate and Rhythm: Normal rate and regular rhythm.     Pulses: Normal pulses.     Heart sounds: Normal heart sounds. No murmur heard.    No friction rub. No gallop.  Pulmonary:     Effort: Pulmonary effort is normal. No respiratory distress.     Breath sounds: Normal breath sounds. No wheezing, rhonchi or rales.  Chest:     Chest wall: No tenderness.  Abdominal:     General: Bowel sounds are normal. There is no distension.     Palpations: Abdomen is soft. There is no mass.     Tenderness: There is no abdominal tenderness. There is no right CVA tenderness, left CVA tenderness, guarding or rebound.  Musculoskeletal:        General: No swelling or tenderness. Normal range of motion.     Cervical back: Normal range of motion. No rigidity or tenderness.     Lumbar back: No swelling or tenderness. Normal range of motion. Negative right straight leg raise test and negative left straight leg raise test.     Right lower leg: No edema.     Left lower leg: No edema.  Lymphadenopathy:     Cervical: No cervical adenopathy.  Skin:    General: Skin is warm and dry.     Coloration: Skin is not pale.     Findings: No bruising, erythema, lesion or rash.  Neurological:     Mental Status: He is alert and oriented to person, place, and time.     Cranial Nerves: No cranial nerve deficit.     Sensory: No sensory deficit.     Motor: No weakness.     Coordination: Coordination normal.     Gait: Gait normal.  Psychiatric:        Mood and Affect: Mood normal.        Speech: Speech normal.        Behavior: Behavior normal.        Thought Content: Thought content normal.        Judgment: Judgment normal.     Labs reviewed: Recent Labs    09/30/23 0815  NA 140  K 3.9  CL 105  CO2 24  GLUCOSE 92  BUN 17  CREATININE 0.89  CALCIUM 9.4   Recent Labs    09/30/23 0815  AST 30  ALT 58*  BILITOT 0.5  PROT 6.9   Recent Labs    09/30/23 0815  WBC 10.4  NEUTROABS 7,124  HGB 15.2  HCT 45.7  MCV 97.4  PLT 262   No results found for: "TSH" No results found for: "HGBA1C" No results found for: "CHOL", "HDL", "LDLCALC", "LDLDIRECT", "TRIG", "CHOLHDL"  Significant Diagnostic Results in last 30  days:  No results found.  Assessment/Plan  1. Acute left-sided low back pain without sciatica Sustained due to reaching over to lift loads.pain has improved but not resolved worst when trying to bend over to pickup load. - OTC analgesic as needed   2. Mixed hyperlipidemia Lipid pane not draw with the rest of lab work reports was not sure if covered by insurance. - dietary modification and exercise advised  - Lipid Panel  Family/ staff Communication: Reviewed plan of care with patient verbalized understanding  Labs/tests ordered:  - Lipid Panel  Next Appointment: Return if symptoms worsen or fail to improve.   Caesar Bookman, NP

## 2023-10-06 LAB — LIPID PANEL
Cholesterol: 207 mg/dL — ABNORMAL HIGH (ref <200)
HDL: 58 mg/dL (ref >=40)
LDL Cholesterol (Calc): 131 mg/dL — ABNORMAL HIGH
Non-HDL Cholesterol (Calc): 149 mg/dL — ABNORMAL HIGH (ref <130)
Total CHOL/HDL Ratio: 3.6 (calc) (ref <5.0)
Triglycerides: 84 mg/dL (ref <150)

## 2023-10-08 ENCOUNTER — Other Ambulatory Visit: Payer: Self-pay

## 2023-10-08 DIAGNOSIS — E782 Mixed hyperlipidemia: Secondary | ICD-10-CM

## 2024-06-02 ENCOUNTER — Ambulatory Visit: Admitting: Orthopedic Surgery

## 2024-06-02 ENCOUNTER — Encounter: Payer: Self-pay | Admitting: Orthopedic Surgery

## 2024-06-02 ENCOUNTER — Telehealth: Payer: Self-pay

## 2024-06-02 VITALS — BP 118/78 | HR 80 | Temp 98.5°F | Ht 67.0 in | Wt 161.8 lb

## 2024-06-02 DIAGNOSIS — G8929 Other chronic pain: Secondary | ICD-10-CM | POA: Diagnosis not present

## 2024-06-02 DIAGNOSIS — M545 Low back pain, unspecified: Secondary | ICD-10-CM

## 2024-06-02 NOTE — Telephone Encounter (Signed)
 Spoke with patient informed patient he is able to pick up his paperwork tomorrow before 3pm.     Copied from CRM [id number]. Topic: General - Call Back - No Documentation >> Jun 02, 2024  4:00 PM Cherylann S wrote: Reason for CRM: Patient is returning Jasmine's call regarding form completion. Please contact patient at [phone].

## 2024-06-02 NOTE — Progress Notes (Signed)
 Careteam: Patient Care Team: Ngetich, Roxan BROCKS, NP as PCP - General (Family Medicine)  Seen by: Greig Cluster, AGNP-C  PLACE OF SERVICE:  [facility]  Advanced Directive information    No Known Allergies  Chief Complaint  Patient presents with   Medical Management of Chronic Issues    FMLA recertification     HPI: Patient is a 58 y.o. male seen today for acute visit due to chronic back pain.   Discussed the use of AI scribe software for clinical note transcription with the patient, who gave verbal consent to proceed.  History of Present Illness    09/2023 he began to have back pain after lifting a bag of sand at home. After incident, heavy lifting or bending would increase lower back pain. The pain is described as 'stabbing' and occurs when bending. Initially, the pain was not severe, but he now experiences episodes where he feels 'stuck' on one side, prompting him to avoid certain activities to prevent exacerbation.  Initial FMLA paperwork was filled out 09/2023. He has had to call out from work twice in the past six months due to the back pain. He performs stretching exercises in the morning before work to help manage the pain. He does not currently take any prescribed medications for his back pain but uses ibuprofen as needed when the pain occurs.  No numbness or tingling down his legs. No loss of B&B.He used to take a muscle relaxer, Flexeril , but no longer uses it.   Review of Systems:  Review of Systems  Constitutional: Negative.   HENT: Negative.    Respiratory: Negative.    Cardiovascular: Negative.   Musculoskeletal:  Positive for back pain. Negative for falls, joint pain, myalgias and neck pain.  Psychiatric/Behavioral: Negative.      Past Medical History:  Diagnosis Date   Alcohol use    Former light cigarette smoker (1-9 per day)    Headache behind the eyes    Past Surgical History:  Procedure Laterality Date   HAND SURGERY Right    Social History:    reports that he has been smoking cigars. He has never used smokeless tobacco. He reports current alcohol use of about 6.0 standard drinks of alcohol per week. He reports that he does not use drugs.  Family History  Problem Relation Age of Onset   Asthma Mother    Hypertension Mother    Hypertension Father    Melanoma Brother        on the spine   Asthma Brother     Medications: Patient's Medications  New Prescriptions   No medications on file  Previous Medications   No medications on file  Modified Medications   No medications on file  Discontinued Medications   CYCLOBENZAPRINE  (FLEXERIL ) 10 MG TABLET    Take 1 tablet (10 mg total) by mouth 2 (two) times daily as needed for muscle spasms.   MECLIZINE  (ANTIVERT ) 25 MG TABLET    Take 1 tablet (25 mg total) by mouth 3 (three) times daily as needed for dizziness.    Physical Exam:  Vitals:   06/02/24 0935  BP: 118/78  Pulse: (!) 103  Temp: 98.5 F (36.9 C)  SpO2: 98%  Weight: 161 lb 12.8 oz (73.4 kg)  Height: 5' 7 (1.702 m)   Body mass index is 25.34 kg/m. Wt Readings from Last 3 Encounters:  06/02/24 161 lb 12.8 oz (73.4 kg)  10/05/23 168 lb 6.4 oz (76.4 kg)  09/29/23 167  lb 9.6 oz (76 kg)    Physical Exam Vitals reviewed.  Constitutional:      General: He is not in acute distress. HENT:     Head: Normocephalic.   Eyes:     General:        Right eye: No discharge.        Left eye: No discharge.    Cardiovascular:     Rate and Rhythm: Normal rate and regular rhythm.     Pulses: Normal pulses.     Heart sounds: Normal heart sounds.  Pulmonary:     Effort: Pulmonary effort is normal.     Breath sounds: Normal breath sounds.  Abdominal:     Palpations: Abdomen is soft.   Musculoskeletal:     Cervical back: Neck supple.     Thoracic back: Normal.     Lumbar back: Tenderness present. No swelling, deformity or bony tenderness. Decreased range of motion. No scoliosis.   Skin:    General: Skin is warm.      Capillary Refill: Capillary refill takes less than 2 seconds.   Neurological:     General: No focal deficit present.     Mental Status: He is alert and oriented to person, place, and time.   Psychiatric:        Mood and Affect: Mood normal.     Labs reviewed: Basic Metabolic Panel: Recent Labs    09/30/23 0815  NA 140  K 3.9  CL 105  CO2 24  GLUCOSE 92  BUN 17  CREATININE 0.89  CALCIUM 9.4   Liver Function Tests: Recent Labs    09/30/23 0815  AST 30  ALT 58*  BILITOT 0.5  PROT 6.9   No results for input(s): LIPASE, AMYLASE in the last 8760 hours. No results for input(s): AMMONIA in the last 8760 hours. CBC: Recent Labs    09/30/23 0815  WBC 10.4  NEUTROABS 7,124  HGB 15.2  HCT 45.7  MCV 97.4  PLT 262   Lipid Panel: Recent Labs    10/05/23 1022  CHOL 207*  HDL 58  LDLCALC 131*  TRIG 84  CHOLHDL 3.6   TSH: No results for input(s): TSH in the last 8760 hours. A1C: No results found for: HGBA1C   Assessment/Plan 1. Chronic midline low back pain without sciatica (Primary) - began 09/2023 after lifting bag of sand at home - used FMLA twice in past 6 months - heavy lifting and bending can increase back pain - exam unremarkable - no past evaluation by spine/ortho specialist - off Flexeril   - continue morning stretching and light walking - avoid heavy lifting  - cont ibuprofen prn - will complete FMLA for next 6 months   Total time: 31. Greater than 50% of total time spent doing patient education regarding chronic back pain including symptom/medication management.     Next appt: Visit date not found  Sedra Morfin Gil BODILY  [facility] [phone]

## 2024-06-02 NOTE — Telephone Encounter (Signed)
 I called patient to notify him that FMLA paper work has been completed. Patient didn't answer and voicemail was left with office call back number. I have placed patient papers in an envelope up front for pick up. Message routed to admin staff and Greig Cluster, NP as RICK.

## 2024-09-30 ENCOUNTER — Encounter: Payer: BC Managed Care – PPO | Admitting: Family

## 2024-10-10 ENCOUNTER — Ambulatory Visit: Payer: Self-pay | Admitting: Family

## 2024-10-10 ENCOUNTER — Encounter: Payer: Self-pay | Admitting: Family

## 2024-10-10 VITALS — BP 120/76 | HR 72 | Temp 97.7°F | Resp 20 | Ht 67.0 in | Wt 157.8 lb

## 2024-10-10 DIAGNOSIS — F1090 Alcohol use, unspecified, uncomplicated: Secondary | ICD-10-CM | POA: Diagnosis not present

## 2024-10-10 DIAGNOSIS — Z0000 Encounter for general adult medical examination without abnormal findings: Secondary | ICD-10-CM

## 2024-10-10 DIAGNOSIS — M25562 Pain in left knee: Secondary | ICD-10-CM

## 2024-10-10 DIAGNOSIS — M25462 Effusion, left knee: Secondary | ICD-10-CM

## 2024-10-10 DIAGNOSIS — M545 Low back pain, unspecified: Secondary | ICD-10-CM

## 2024-10-10 DIAGNOSIS — K047 Periapical abscess without sinus: Secondary | ICD-10-CM

## 2024-10-10 DIAGNOSIS — E782 Mixed hyperlipidemia: Secondary | ICD-10-CM | POA: Diagnosis not present

## 2024-10-10 DIAGNOSIS — G8929 Other chronic pain: Secondary | ICD-10-CM

## 2024-10-10 DIAGNOSIS — M62838 Other muscle spasm: Secondary | ICD-10-CM

## 2024-10-10 DIAGNOSIS — R519 Headache, unspecified: Secondary | ICD-10-CM

## 2024-10-10 LAB — LIPID PANEL
Cholesterol: 178 mg/dL (ref <200)
HDL: 51 mg/dL (ref >=40)
LDL Cholesterol (Calc): 109 mg/dL — ABNORMAL HIGH
Non-HDL Cholesterol (Calc): 127 mg/dL (ref <130)
Total CHOL/HDL Ratio: 3.5 (calc) (ref <5.0)
Triglycerides: 87 mg/dL (ref <150)

## 2024-10-10 LAB — CBC WITH DIFFERENTIAL/PLATELET
Absolute Lymphocytes: 1613 {cells}/uL (ref 850–3900)
Absolute Monocytes: 650 {cells}/uL (ref 200–950)
Basophils Absolute: 63 {cells}/uL (ref 0–200)
Basophils Relative: 1.1 %
Eosinophils Absolute: 120 {cells}/uL (ref 15–500)
Eosinophils Relative: 2.1 %
HCT: 45.2 % (ref 38.5–50.0)
Hemoglobin: 14.8 g/dL (ref 13.2–17.1)
MCH: 32.1 pg (ref 27.0–33.0)
MCHC: 32.7 g/dL (ref 32.0–36.0)
MCV: 98 fL (ref 80.0–100.0)
MPV: 10.2 fL (ref 7.5–12.5)
Monocytes Relative: 11.4 %
Neutro Abs: 3255 {cells}/uL (ref 1500–7800)
Neutrophils Relative %: 57.1 %
Platelets: 281 Thousand/uL (ref 140–400)
RBC: 4.61 Million/uL (ref 4.20–5.80)
RDW: 11 % (ref 11.0–15.0)
Total Lymphocyte: 28.3 %
WBC: 5.7 Thousand/uL (ref 3.8–10.8)

## 2024-10-10 MED ORDER — CYCLOBENZAPRINE HCL 10 MG PO TABS: 10.0000 mg | ORAL_TABLET | Freq: Three times a day (TID) | ORAL | 0 refills | Status: AC | PRN

## 2024-10-10 NOTE — Progress Notes (Signed)
 Provider: Roxan Plough FNP-C   Sydne Krahl, Roxan BROCKS, NP  Patient Care Team: Delton Stelle, Roxan BROCKS, NP as PCP - General (Family Medicine)  Extended Emergency Contact Information Primary Emergency Contact: Cresop Nieves,[place]  [place] Mobile Phone: [phone] Relation: Nephew  Code Status:  Full Code  Goals of care: Advanced Directive information    10/10/2024    9:26 AM  Advanced Directives  Does Patient Have a Medical Advance Directive? No  Would patient like information on creating a medical advance directive? No - Patient declined     Chief Complaint  Patient presents with   Annual Exam    Physical.    Discussed the use of AI scribe software for clinical note transcription with the patient, who gave verbal consent to proceed.  History of Present Illness   Ricardo Robertson is a 58 year old male who presents for an annual physical exam.  He experiences discomfort in the back of his neck, describing it as a small, difficult-to-explain pain that occurs during normal activities such as showering. It sometimes feels like a pinched nerve. No popping sensation, pain radiating to the legs, weakness in the legs, or issues with incontinence are noted.  He has chronic low back pain. He has not had an x-ray for his lower back previously. He also experiences occasional headaches, sometimes occurring behind his eye.  He describes a sensation of swelling behind his left knee, which is more noticeable when he exercises excessively, such as running. The swelling is sometimes painful.  He experiences neck discomfort, particularly upon waking, despite using a new pillow. One side of his neck feels tired, requiring posture adjustments to alleviate discomfort. No pain is felt when turning his neck side to side, but tiredness is noted on one side.  He is currently taking amoxicillin for a tooth infection on the right side of his mouth.  He occasionally consumes alcohol amount  varies from time to time.  His last lab work showed elevated cholesterol levels with a total cholesterol of 207 mg/dL and LDL cholesterol of 131 mg/dL. He has not been exercising regularly but has started some standing exercises with video guidance.    Past Medical History:  Diagnosis Date   Alcohol use    Former light cigarette smoker (1-9 per day)    Headache behind the eyes    Past Surgical History:  Procedure Laterality Date   HAND SURGERY Right     No Known Allergies  Allergies as of 10/10/2024   No Known Allergies      Medication List        Accurate as of October 10, 2024 10:19 AM. If you have any questions, ask your nurse or doctor.          amoxicillin-clavulanate 250-125 MG tablet Commonly known as: AUGMENTIN Take 125 mg by mouth every 12 (twelve) hours.   cyclobenzaprine  10 MG tablet Commonly known as: FLEXERIL  Take 1 tablet (10 mg total) by mouth 3 (three) times daily as needed for muscle spasms. Started by: Alenah Sarria C Vitali Seibert   ibuprofen 200 MG tablet Commonly known as: ADVIL Take 200 mg by mouth as needed.        Review of Systems  Constitutional:  Negative for appetite change, chills, fatigue, fever and unexpected weight change.  HENT:  Positive for dental problem. Negative for congestion, ear discharge, ear pain, facial swelling, hearing loss, nosebleeds, postnasal drip, rhinorrhea, sinus pressure, sinus pain, sneezing, sore throat, tinnitus and trouble swallowing.  Current on antibiotics for dental infection prescribed by dentist    Eyes:  Negative for pain, discharge, redness, itching and visual disturbance.  Respiratory:  Negative for cough, chest tightness, shortness of breath and wheezing.   Cardiovascular:  Negative for chest pain, palpitations and leg swelling.  Gastrointestinal:  Negative for abdominal distention, abdominal pain, blood in stool, constipation, diarrhea, nausea and vomiting.  Endocrine: Negative for cold intolerance,  heat intolerance, polydipsia, polyphagia and polyuria.  Genitourinary:  Negative for difficulty urinating, dysuria, flank pain, frequency and urgency.  Musculoskeletal:  Positive for back pain and neck pain. Negative for arthralgias, gait problem, joint swelling, myalgias and neck stiffness.  Skin:  Negative for color change, pallor, rash and wound.  Neurological:  Negative for dizziness, syncope, speech difficulty, weakness, light-headedness, numbness and headaches.       Occasional headache   Hematological:  Does not bruise/bleed easily.  Psychiatric/Behavioral:  Negative for agitation, behavioral problems, confusion, hallucinations, self-injury, sleep disturbance and suicidal ideas. The patient is not nervous/anxious.      There is no immunization history on file for this patient. Pertinent  Health Maintenance Due  Topic Date Due   Colonoscopy  Never done   Influenza Vaccine  04/18/2025 (Originally 07/08/2024)      10/21/2021    8:06 PM 06/02/2024   10:08 AM 10/10/2024    9:25 AM  Fall Risk  Falls in the past year?  0 0  Was there an injury with Fall?  0 0  Fall Risk Category Calculator  0 0  (RETIRED) Patient Fall Risk Level Low fall risk     Patient at Risk for Falls Due to  No Fall Risks No Fall Risks  Fall risk Follow up  Falls evaluation completed Falls evaluation completed     Data saved with a previous flowsheet row definition   Functional Status Survey:    Vitals:   10/10/24 0933  BP: 120/76  Pulse: 72  Resp: 20  Temp: 97.7 F (36.5 C)  SpO2: 96%  Weight: 157 lb 12.8 oz (71.6 kg)  Height: 5' 7 (1.702 m)   Body mass index is 24.71 kg/m. Physical Exam  VITALS: T- 97.7, P- 72, BP- 120/76, SaO2- 96% MEASUREMENTS: Weight- 157. GENERAL: Alert, cooperative, well developed, no acute distress. HEENT: Normocephalic, normal oropharynx, moist mucous membranes, ears normal without infection or cerumen impaction, nose normal. NECK: Neck pain on movement. CHEST: Clear  to auscultation bilaterally, no wheezes, rhonchi, or crackles, no tenderness on back. CARDIOVASCULAR: Normal heart rate and rhythm, S1 and S2 normal without murmurs. ABDOMEN: Soft, non-tender, non-distended, without organomegaly, normal bowel sounds. EXTREMITIES: No cyanosis or edema. MUSCULOSKELETAL: Knees normal range of motion, no pain.Negative straight leg Raise  NEUROLOGICAL: Cranial nerves grossly intact, moves all extremities without gross motor or sensory deficit.  SKIN: No rash,no lesion or erythema   PSYCHIATRY/BEHAVIORAL: Mood stable    Labs reviewed: No results for input(s): NA, K, CL, CO2, GLUCOSE, BUN, CREATININE, CALCIUM, MG, PHOS in the last 8760 hours. No results for input(s): AST, ALT, ALKPHOS, BILITOT, PROT, ALBUMIN in the last 8760 hours. No results for input(s): WBC, NEUTROABS, HGB, HCT, MCV, PLT in the last 8760 hours. No results found for: TSH No results found for: HGBA1C Lab Results  Component Value Date   CHOL 207 (H) 10/05/2023   HDL 58 10/05/2023   LDLCALC 131 (H) 10/05/2023   TRIG 84 10/05/2023   CHOLHDL 3.6 10/05/2023    Significant Diagnostic Results in last 30 days:  No  results found.  Assessment/Plan  Annual Physical Exam  Routine adult wellness visits with stable vital signs: blood pressure 120/76 mmHg, heart rate 72 bpm, temperature 97.82F, oxygen saturation 96%. Weight decreased from 161 lbs in June to 157 lbs currently. No exercise routine reported. Last lab work was one year ago. - Ordered lab work including cholesterol, thyroid, kidney and liver function tests, electrolytes, and CBC - Encouraged continuation of healthy diet and exercise  Chronic low back pain Intermittent low back pain, possibly related to muscle strain or improper lifting techniques. No radiation to legs, weakness, or incontinence. Pain exacerbated by bending and lifting without proper technique. - Ordered x-ray of the lower  back - Advised on proper lifting techniques using legs to prevent back injury  Left knee pain and swelling Intermittent left knee pain and swelling, possibly related to Baker's cyst. Pain exacerbated by exercise, particularly running. - Ordered x-ray of the left knee - Advised on moderation of exercise to prevent exacerbation of symptoms  Neck pain and muscle spasm Neck pain and muscle spasm, possibly related to poor posture or muscle strain. Pain exacerbated by certain movements and positions. No significant pain with neck movement. - Prescribed Flexeril  for muscle relaxation, to be taken at bedtime to avoid drowsiness while driving - Advised on proper posture and avoiding excessive bending of the neck  Headache, recurrent Recurrent headaches, previously evaluated with MRI and CT of the head in 2022. - continue current pain regimen consider referral to Neurologist if symptoms worsen   Dental infection, right posterior tooth Dental infection in the right posterior tooth, currently being treated with amoxicillin. Prefers root canal treatment despite cost concerns. - Discuss root canal treatment options with dentist  Hyperlipidemia Previous lab work showing elevated total cholesterol (207 mg/dL) and LDL cholesterol (868 mg/dL). Triglycerides and HDL cholesterol were within normal limits. - Ordered repeat cholesterol panel  Alcohol use, current Current alcohol use with variable consumption. No specific quantity or frequency reported. - Alcohol use cessation advised  - Ordered liver function tests to monitor for potential liver damage   Family/ staff Communication: Reviewed plan of care with patient verbalized understanding   Labs/tests ordered:  - CBC with Differential/Platelet - CMP with eGFR([facility]) - Lipid panel - Dg lumbar spine  - DG left knee   Next Appointment : Return in about 1 year (around 10/10/2025) for annual Physical examination.   Spent 30 minutes of Face to face  and non-face to face with patient  >50% time spent counseling; reviewing medical record; tests; labs; documentation and developing future plan of care.   Roxan JAYSON Plough, NP

## 2024-10-10 NOTE — Patient Instructions (Signed)
-   Please get Lower back and left knee X-ray at [facility] imaging at [facility] then will call you with results.

## 2024-10-19 ENCOUNTER — Ambulatory Visit: Payer: Self-pay | Admitting: Family

## 2025-10-11 ENCOUNTER — Encounter: Admitting: Family
